# Patient Record
Sex: Female | Born: 2000 | Race: White | Hispanic: No | Marital: Single | Smoking: Never smoker
Health system: Southern US, Community
[De-identification: ages and names within clinical notes are randomized; demographics above are authoritative.]

## PROBLEM LIST (undated history)

## (undated) DIAGNOSIS — Z973 Presence of spectacles and contact lenses: Secondary | ICD-10-CM

## (undated) DIAGNOSIS — J45909 Unspecified asthma, uncomplicated: Secondary | ICD-10-CM

## (undated) DIAGNOSIS — Z98811 Dental restoration status: Secondary | ICD-10-CM

## (undated) HISTORY — PX: NO PAST SURGERIES: SHX2092

---
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
## 2022-08-11 ENCOUNTER — Ambulatory Visit: Payer: Self-pay | Admitting: Neurology
